# Patient Record
Sex: Male | Born: 2000
Health system: Southern US, Community
[De-identification: ages and names within clinical notes are randomized; demographics above are authoritative.]

## PROBLEM LIST (undated history)

## (undated) DIAGNOSIS — J45909 Unspecified asthma, uncomplicated: Secondary | ICD-10-CM

## (undated) DIAGNOSIS — T7840XA Allergy, unspecified, initial encounter: Secondary | ICD-10-CM

## (undated) HISTORY — DX: Allergy, unspecified, initial encounter: T78.40XA

---
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
## 2018-03-17 ENCOUNTER — Other Ambulatory Visit: Payer: Self-pay

## 2018-03-17 ENCOUNTER — Ambulatory Visit: Payer: 59 | Admitting: Family Medicine

## 2018-03-17 ENCOUNTER — Ambulatory Visit (HOSPITAL_BASED_OUTPATIENT_CLINIC_OR_DEPARTMENT_OTHER)
Admission: RE | Admit: 2018-03-17 | Discharge: 2018-03-17 | Disposition: A | Discharge status: Home / Self Care | Payer: 59 | Source: Ambulatory Visit | Attending: Family Medicine | Admitting: Family Medicine

## 2018-03-17 ENCOUNTER — Encounter: Payer: Self-pay | Admitting: Family Medicine

## 2018-03-17 VITALS — BP 123/73 | HR 75 | Ht 67.0 in | Wt 185.0 lb

## 2018-03-17 DIAGNOSIS — S6991XA Unspecified injury of right wrist, hand and finger(s), initial encounter: Secondary | ICD-10-CM | POA: Diagnosis not present

## 2018-03-17 NOTE — Patient Instructions (Signed)
Your x-rays and ultrasound look great. You have a finger sprain. Buddy taping as needed especially with sports the next 2 weeks. Icing, tylenol, ibuprofen if needed. Follow up with me as needed.

## 2018-03-19 ENCOUNTER — Encounter: Payer: Self-pay | Admitting: Family Medicine

## 2018-03-19 NOTE — Progress Notes (Signed)
PCP: Donato Schultz, DO  Subjective:   HPI: Patient is a 18 y.o. male here for right hand injury.  Patient reports he was playing basketball on 3/10 when the basketball hit his hand specifically over 3rd digit causing pain, throbbing, difficulty flexing this finger. Pain was severe but has improved some since then and is down to 5/10 level. Not taking any medicines for this but icing which has helped. No skin changes, numbness. No swelling.  Past Medical History:  Diagnosis Date  . Allergy   . Asthma     Current Outpatient Medications on File Prior to Visit  Medication Sig Dispense Refill  . ADVAIR HFA 115-21 MCG/ACT inhaler   4  . albuterol (PROVENTIL HFA;VENTOLIN HFA) 108 (90 BASE) MCG/ACT inhaler Inhale into the lungs every 6 (six) hours as needed for wheezing or shortness of breath.    Marland Kitchen albuterol (PROVENTIL) (2.5 MG/3ML) 0.083% nebulizer solution Take 2.5 mg by nebulization every 6 (six) hours as needed for wheezing or shortness of breath.    . EPINEPHrine 0.3 mg/0.3 mL IJ SOAJ injection   1  . fluticasone (FLONASE) 50 MCG/ACT nasal spray   6  . levocetirizine (XYZAL) 5 MG tablet Take 5 mg by mouth every evening.    . loratadine (CLARITIN) 10 MG tablet Take 10 mg by mouth daily.    . montelukast (SINGULAIR) 10 MG tablet Take 10 mg by mouth at bedtime.     No current facility-administered medications on file prior to visit.     History reviewed. No pertinent surgical history.  Allergies  Allergen Reactions  . Iodine Anaphylaxis  . Peanuts [Peanut Oil] Anaphylaxis  . Shellfish Allergy Anaphylaxis  . Soybean-Containing Drug Products Anaphylaxis    Social History   Socioeconomic History  . Marital status: Single    Spouse name: Not on file  . Number of children: Not on file  . Years of education: Not on file  . Highest education level: Not on file  Occupational History  . Not on file  Social Needs  . Financial resource strain: Not on file  . Food  insecurity:    Worry: Not on file    Inability: Not on file  . Transportation needs:    Medical: Not on file    Non-medical: Not on file  Tobacco Use  . Smoking status: Never Smoker  . Smokeless tobacco: Never Used  Substance and Sexual Activity  . Alcohol use: No    Alcohol/week: 0.0 standard drinks  . Drug use: No  . Sexual activity: Not on file  Lifestyle  . Physical activity:    Days per week: Not on file    Minutes per session: Not on file  . Stress: Not on file  Relationships  . Social connections:    Talks on phone: Not on file    Gets together: Not on file    Attends religious service: Not on file    Active member of club or organization: Not on file    Attends meetings of clubs or organizations: Not on file    Relationship status: Not on file  . Intimate partner violence:    Fear of current or ex partner: Not on file    Emotionally abused: Not on file    Physically abused: Not on file    Forced sexual activity: Not on file  Other Topics Concern  . Not on file  Social History Narrative  . Not on file    Family History  Problem Relation Age of Onset  . Diabetes Maternal Grandmother   . Diabetes Paternal Grandmother   . Kidney disease Paternal Grandmother        Drug Induced  . Hypertension Paternal Grandmother   . Hypertension Maternal Grandmother     BP 123/73   Pulse 75   Ht 5\' 7"  (1.702 m)   Wt 185 lb (83.9 kg)   BMI 28.98 kg/m   Review of Systems: See HPI above.     Objective:  Physical Exam:  Gen: NAD, comfortable in exam room  Right hand/digits: No deformity, malrotation, angulation, swelling. FROM with 5/5 strength flexion and extension digits. Mild TTP proximal phalanx 3rd digit.  No other tenderness. NVI distally.  Left hand/digits: No deformity. FROM with 5/5 strength. No tenderness to palpation. NVI distally.   Assessment & Plan:  1. Right 3rd digit injury - independently reviewed radiographs, performed and reviewed brief  MSK u/s which were negative for fracture, tendon injury.  2/2 mild sprain.  Reassured.  Buddy taping, icing, tylenol, ibuprofen if needed.  F/u prn.

## 2018-08-22 ENCOUNTER — Encounter (HOSPITAL_COMMUNITY): Payer: Self-pay | Admitting: Emergency Medicine

## 2018-08-22 ENCOUNTER — Other Ambulatory Visit: Payer: Self-pay

## 2018-08-22 ENCOUNTER — Emergency Department (HOSPITAL_COMMUNITY)
Admission: EM | Admit: 2018-08-22 | Discharge: 2018-08-23 | Disposition: A | Discharge status: Home / Self Care | Payer: 59 | Attending: Emergency Medicine | Admitting: Emergency Medicine

## 2018-08-22 ENCOUNTER — Emergency Department (HOSPITAL_COMMUNITY): Payer: 59

## 2018-08-22 DIAGNOSIS — J45909 Unspecified asthma, uncomplicated: Secondary | ICD-10-CM | POA: Diagnosis not present

## 2018-08-22 DIAGNOSIS — Y92410 Unspecified street and highway as the place of occurrence of the external cause: Secondary | ICD-10-CM | POA: Insufficient documentation

## 2018-08-22 DIAGNOSIS — Y999 Unspecified external cause status: Secondary | ICD-10-CM | POA: Insufficient documentation

## 2018-08-22 DIAGNOSIS — M7918 Myalgia, other site: Secondary | ICD-10-CM

## 2018-08-22 DIAGNOSIS — Y93I9 Activity, other involving external motion: Secondary | ICD-10-CM | POA: Diagnosis not present

## 2018-08-22 DIAGNOSIS — M791 Myalgia, unspecified site: Secondary | ICD-10-CM | POA: Diagnosis not present

## 2018-08-22 DIAGNOSIS — M79661 Pain in right lower leg: Secondary | ICD-10-CM | POA: Diagnosis present
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
## 2018-08-23 MED ORDER — IBUPROFEN 400 MG PO TABS
400.0000 mg | ORAL_TABLET | Freq: Once | ORAL | Status: AC
  Administered 2018-08-23: 400 mg via ORAL
  Filled 2018-08-23: qty 1
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
## 2020-03-01 ENCOUNTER — Other Ambulatory Visit (HOSPITAL_COMMUNITY): Payer: Self-pay | Admitting: Family Medicine

## 2020-03-01 MED FILL — AZITHROMYCIN 500 MG TABLET: 500 | 1 days supply | Qty: 2 | Fill #0

## 2020-04-16 ENCOUNTER — Other Ambulatory Visit: Payer: Self-pay

## 2020-04-17 ENCOUNTER — Ambulatory Visit (INDEPENDENT_AMBULATORY_CARE_PROVIDER_SITE_OTHER): Payer: 59 | Admitting: Family Medicine

## 2020-04-17 ENCOUNTER — Other Ambulatory Visit: Payer: Self-pay

## 2020-04-17 ENCOUNTER — Encounter: Payer: Self-pay | Admitting: Family Medicine

## 2020-04-17 VITALS — BP 102/68 | HR 78 | Temp 98.1°F | Resp 18 | Ht 67.0 in | Wt 123.0 lb

## 2020-04-17 DIAGNOSIS — Z1159 Encounter for screening for other viral diseases: Secondary | ICD-10-CM | POA: Diagnosis not present

## 2020-04-17 DIAGNOSIS — J302 Other seasonal allergic rhinitis: Secondary | ICD-10-CM

## 2020-04-17 DIAGNOSIS — Z23 Encounter for immunization: Secondary | ICD-10-CM

## 2020-04-17 DIAGNOSIS — Z0000 Encounter for general adult medical examination without abnormal findings: Secondary | ICD-10-CM | POA: Diagnosis not present

## 2020-04-17 DIAGNOSIS — J4520 Mild intermittent asthma, uncomplicated: Secondary | ICD-10-CM | POA: Diagnosis not present

## 2020-04-17 MED ORDER — MONTELUKAST SODIUM 10 MG PO TABS: 10.0000 mg | ORAL_TABLET | Freq: Every day | ORAL | 3 refills | Status: AC

## 2020-04-17 MED ORDER — ALBUTEROL SULFATE HFA 108 (90 BASE) MCG/ACT IN AERS
2.0000 | INHALATION_SPRAY | Freq: Four times a day (QID) | RESPIRATORY_TRACT | 3 refills | Status: AC | PRN
Start: 2020-04-17 — End: ?

## 2020-04-17 MED ORDER — ADVAIR HFA 115-21 MCG/ACT IN AERO: 2.0000 | INHALATION_SPRAY | Freq: Two times a day (BID) | RESPIRATORY_TRACT | 4 refills | Status: AC

## 2020-04-17 MED ORDER — LEVOCETIRIZINE DIHYDROCHLORIDE 5 MG PO TABS: 5.0000 mg | ORAL_TABLET | Freq: Every evening | ORAL | 3 refills | Status: AC

## 2020-04-17 NOTE — Patient Instructions (Signed)
Preventive Care 18-21 Years Old, Male Preventive care refers to lifestyle choices and visits with your health care provider that can promote health and wellness. At this stage in your life, you may start seeing a primary care physician instead of a pediatrician. It is important to take responsibility for your health and well-being. Preventive care for young adults includes:  A yearly physical exam. This is also called an annual wellness visit.  Regular dental and eye exams.  Immunizations.  Screening for certain conditions.  Healthy lifestyle choices, such as: ? Eating a healthy diet. ? Getting regular exercise. ? Not using drugs or products that contain nicotine and tobacco. ? Limiting alcohol use. What can I expect for my preventive care visit? Physical exam Your health care provider may check your:  Height and weight. These may be used to calculate your BMI (body mass index). BMI is a measurement that tells if you are at a healthy weight.  Heart rate and blood pressure.  Body temperature.  Skin for abnormal spots. Counseling Your health care provider may ask you questions about your:  Past medical problems.  Family's medical history.  Alcohol, tobacco, and drug use.  Home life and relationship well-being.  Access to firearms.  Emotional well-being.  Diet, exercise, and sleep habits.  Sexual activity and sexual health. What immunizations do I need? Vaccines are usually given at various ages, according to a schedule. Your health care provider will recommend vaccines for you based on your age, medical history, and lifestyle or other factors, such as travel or where you work.   What tests do I need? Blood tests  Lipid and cholesterol levels. These may be checked every 5 years starting at age 20.  Hepatitis C test.  Hepatitis B test. Screening  Genital exam to check for testicular cancer or hernias.  STD (sexually transmitted disease) testing, if you are at  risk. Other tests  Tuberculosis skin test.  Vision and hearing tests.  Skin exam. Talk with your health care provider about your test results, treatment options, and if necessary, the need for more tests. Follow these instructions at home: Eating and drinking  Eat a healthy diet that includes fresh fruits and vegetables, whole grains, lean protein, and low-fat dairy products.  Drink enough fluid to keep your urine pale yellow.  Do not drink alcohol if: ? Your health care provider tells you not to drink. ? You are under the legal drinking age. In the [place] the legal drinking age is 21.  If you drink alcohol: ? Limit how much you use to 0-2 drinks a day. ? Be aware of how much alcohol is in your drink. In the [place] one drink equals one 12 oz bottle of beer (355 mL), one 5 oz glass of wine (148 mL), or one 1 oz glass of hard liquor (44 mL).   Lifestyle  Take daily care of your teeth and gums. Brush your teeth every morning and night with fluoride toothpaste. Floss one time each day.  Stay active. Exercise for at least 30 minutes 5 or more days of the week.  Do not use any products that contain nicotine or tobacco, such as cigarettes, e-cigarettes, and chewing tobacco. If you need help quitting, ask your health care provider.  Do not use drugs.  If you are sexually active, practice safe sex. Use a condom or other form of protection to prevent STIs (sexually transmitted infections).  Find healthy ways to cope with stress, such as: ? Meditation, yoga,   or listening to music. ? Journaling. ? Talking to a trusted person. ? Spending time with friends and family. Safety  Always wear your seat belt while driving or riding in a vehicle.  Do not drive: ? If you have been drinking alcohol. Do not ride with someone who has been drinking. ? When you are tired or distracted. ? While texting.  Wear a helmet and other protective equipment during sports activities.  If you have  firearms in your house, make sure you follow all gun safety procedures.  Seek help if you have been bullied, physically abused, or sexually abused.  Use the Internet responsibly to avoid dangers, such as online bullying and online sex predators. What's next?  Go to your health care provider once a year for an annual wellness visit.  Ask your health care provider how often you should have your eyes and teeth checked.  Stay up to date on all vaccines. This information is not intended to replace advice given to you by your health care provider. Make sure you discuss any questions you have with your health care provider. Document Revised: 09/08/2018 Document Reviewed: 12/17/2017 Elsevier Patient Education  2021 Elsevier Inc.  

## 2020-04-17 NOTE — Progress Notes (Signed)
Subjective:   By signing my name below, I, Andrew Golden, attest that this documentation has been prepared under the direction and in the presence of Dr. Roma Schanz, DO. 04/17/2020   Patient ID: Andrew Golden, male    DOB: 02-28-2000, 20 y.o.   MRN: [id number]  Chief Complaint  Patient presents with  . Annual Exam    Concerns/questions: Pt says he needs Xzyxal and inhaler. Hep C screen due Covid shots: did not get -tlc,rma    HPI Patient is in today for comprehensive physical exam. He is accompanied by his girlfriend. He is still playing basketball occasionally. He request for his allergy inhaler to manage his asthma. He had to use his inhaler once recently due to "pollen season". He is now able to be near dogs and around shrimp without severe reactions. He would like to request a form is filled out in order for him to buy a dog for emotional support. He denies having shortness of breath, chest pain, tightness, or pressure. He has no nausea, vomiting, or diarrhea. He has no congestion, cough, fever, headache at this time. He has no testicular or scrotal pain.   Past Medical History:  Diagnosis Date  . Allergy   . Asthma     History reviewed. No pertinent surgical history.  Family History  Problem Relation Age of Onset  . Diabetes Maternal Grandmother   . Hypertension Maternal Grandmother   . Diabetes Paternal Grandmother   . Kidney disease Paternal Grandmother        Drug Induced  . Hypertension Paternal Grandmother     Social History   Socioeconomic History  . Marital status: Single    Spouse name: Not on file  . Number of children: Not on file  . Years of education: Not on file  . Highest education level: Not on file  Occupational History  . 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 Smoking status: Never Smoker  . Smokeless tobacco: Never Used  . Tobacco comment: partner on bcp  Vaping Use  . Vaping  Use: Never used  Substance and Sexual Activity  . Alcohol use: No    Alcohol/week: 0.0 standard drinks  . Drug use: Yes    Frequency: 4.0 times per week    Types: Marijuana  . Sexual activity: Yes    Partners: Female  Other Topics Concern  . Not on file  Social History Narrative  . Not on file   Social Determinants of Health   Financial Resource Strain: Not on file  Food Insecurity: Not on file  Transportation Needs: Not on file  Physical Activity: Not on file  Stress: Not on file  Social Connections: Not on file  Intimate Partner Violence: Not on file    Outpatient Medications Prior to Visit  Medication Sig Dispense Refill  . EPINEPHrine 0.3 mg/0.3 mL IJ SOAJ injection   1  . levocetirizine (XYZAL) 5 MG tablet Take 5 mg by mouth every evening.    . montelukast (SINGULAIR) 10 MG tablet Take 10 mg by mouth at bedtime.    Marland Kitchen ADVAIR HFA 115-21 MCG/ACT inhaler  (Patient not taking: Reported on 04/17/2020)  4  . albuterol (PROVENTIL HFA;VENTOLIN HFA) 108 (90 BASE) MCG/ACT inhaler Inhale into the lungs every 6 (six) hours as needed for wheezing or shortness of breath.    Marland Kitchen albuterol (PROVENTIL) (2.5 MG/3ML) 0.083% nebulizer solution Take 2.5 mg by nebulization every  6 (six) hours as needed for wheezing or shortness of breath. (Patient not taking: Reported on 04/17/2020)    . azithromycin (ZITHROMAX) 500 MG tablet TAKE 2 TABLETS BY MOUTH FOR 1 DOSE (Patient not taking: Reported on 04/17/2020) 2 tablet 0  . fluticasone (FLONASE) 50 MCG/ACT nasal spray  (Patient not taking: Reported on 04/17/2020)  6  . loratadine (CLARITIN) 10 MG tablet Take 10 mg by mouth daily. (Patient not taking: Reported on 04/17/2020)     No facility-administered medications prior to visit.    Allergies  Allergen Reactions  . Iodine Anaphylaxis  . Peanuts [Peanut Oil] Anaphylaxis  . Shellfish Allergy Anaphylaxis  . Soybean-Containing Drug Products Anaphylaxis    Review of Systems  Constitutional: Negative for  chills and fever.  HENT: Negative for ear pain, sinus pain and sore throat.   Eyes: Negative for pain.  Respiratory: Negative for cough and shortness of breath.   Cardiovascular: Negative for chest pain and leg swelling.  Genitourinary: Negative for flank pain, frequency and urgency.  Musculoskeletal: Negative for back pain.  Skin: Negative for rash.  Neurological: Negative for dizziness and headaches.       Objective:    Physical Exam Vitals and nursing note reviewed.  Constitutional:      General: He is not in acute distress.    Appearance: Normal appearance. He is not ill-appearing.  HENT:     Head: Normocephalic and atraumatic.     Right Ear: External ear normal.     Left Ear: External ear normal.  Eyes:     Extraocular Movements: Extraocular movements intact.     Pupils: Pupils are equal, round, and reactive to light.  Cardiovascular:     Rate and Rhythm: Normal rate and regular rhythm.     Pulses: Normal pulses.     Heart sounds: No murmur heard.   Pulmonary:     Effort: Pulmonary effort is normal. No respiratory distress.     Breath sounds: Normal breath sounds. No wheezing, rhonchi or rales.  Abdominal:     General: Bowel sounds are normal.     Palpations: Abdomen is soft. There is no mass.     Tenderness: There is no abdominal tenderness.  Musculoskeletal:        General: Normal range of motion.     Cervical back: Normal range of motion.  Skin:    General: Skin is warm and dry.  Neurological:     Mental Status: He is alert and oriented to person, place, and time.  Psychiatric:        Behavior: Behavior normal.     BP 102/68 (BP Location: Left Arm, Patient Position: Sitting, Cuff Size: Normal)   Pulse 78   Temp 98.1 F (36.7 C) (Oral)   Resp 18   Ht _0  (1.702 m)   Wt 123 lb (55.8 kg)   SpO2 96%   BMI 19.26 kg/m  Wt Readings from Last 3 Encounters:  04/17/20 123 lb (55.8 kg)  03/17/18 185 lb (83.9 kg) (85 %, Z= 1.05)*  06/05/15 132 lb (59.9  kg) (36 %, Z= -0.35)*   * Growth percentiles are based on CDC (Boys, 2-20 Years) data.    Diabetic Foot Exam - Simple   No data filed    No results found for: WBC, HGB, HCT, PLT, GLUCOSE, CHOL, TRIG, HDL, LDLDIRECT, LDLCALC, ALT, AST, NA, K, CL, CREATININE, BUN, CO2, TSH, PSA, INR, GLUF, HGBA1C, MICROALBUR  No results found for: TSH No results found for:  WBC, HGB, HCT, MCV, PLT No results found for: NA, K, CHLORIDE, CO2, GLUCOSE, BUN, CREATININE, BILITOT, ALKPHOS, AST, ALT, PROT, ALBUMIN, CALCIUM, ANIONGAP, EGFR, GFR No results found for: CHOL No results found for: HDL No results found for: LDLCALC No results found for: TRIG No results found for: CHOLHDL No results found for: HGBA1C     Assessment & Plan:   Problem List Items Addressed This Visit   None   Visit Diagnoses    Preventative health care    -  Primary   Relevant Orders   CBC with Differential/Platelet   Lipid panel   Comprehensive metabolic panel   TSH   Hepatitis C antibody   HIV Antibody (routine testing w rflx)   RPR   Hepatitis, Acute   Mild intermittent asthma without complication       Relevant Medications   montelukast (SINGULAIR) 10 MG tablet   levocetirizine (XYZAL) 5 MG tablet   albuterol (VENTOLIN HFA) 108 (90 Base) MCG/ACT inhaler   ADVAIR HFA 115-21 MCG/ACT inhaler   Seasonal allergies       Relevant Medications   montelukast (SINGULAIR) 10 MG tablet   levocetirizine (XYZAL) 5 MG tablet   albuterol (VENTOLIN HFA) 108 (90 Base) MCG/ACT inhaler   Need for meningitis vaccination       Relevant Orders   Meningococcal B, OMV (Bexsero) (Completed)   Need for hepatitis C screening test       Relevant Orders   Hepatitis C antibody      Meds ordered this encounter  Medications  . montelukast (SINGULAIR) 10 MG tablet    Sig: Take 1 tablet (10 mg total) by mouth at bedtime.    Dispense:  90 tablet    Refill:  3  . levocetirizine (XYZAL) 5 MG tablet    Sig: Take 1 tablet (5 mg total) by  mouth every evening.    Dispense:  90 tablet    Refill:  3  . albuterol (VENTOLIN HFA) 108 (90 Base) MCG/ACT inhaler    Sig: Inhale 2 puffs into the lungs every 6 (six) hours as needed for wheezing or shortness of breath.    Dispense:  3 each    Refill:  3  . ADVAIR HFA 115-21 MCG/ACT inhaler    Sig: Inhale 2 puffs into the lungs 2 (two) times daily.    Dispense:  1 each    Refill:  4    I, Ann Held, DO, personally preformed the services described in this documentation.  All medical record entries made by the scribe were at my direction and in my presence.  I have reviewed the chart and discharge instructions (if applicable) and agree that the record reflects my personal performance and is accurate and complete.  04/17/2020  I,Alexis Bryant,acting as a scribe for Ann Held, DO.,have documented all relevant documentation on the behalf of Ann Held, DO,as directed by  Ann Held, DO while in the presence of Ann Held, DO.  Ann Held, DO

## 2020-04-19 DIAGNOSIS — J4520 Mild intermittent asthma, uncomplicated: Secondary | ICD-10-CM | POA: Insufficient documentation

## 2020-04-19 DIAGNOSIS — Z0000 Encounter for general adult medical examination without abnormal findings: Secondary | ICD-10-CM | POA: Insufficient documentation

## 2020-04-19 DIAGNOSIS — J302 Other seasonal allergic rhinitis: Secondary | ICD-10-CM | POA: Insufficient documentation

## 2020-04-19 NOTE — Assessment & Plan Note (Signed)
Stable Refill inhalers  

## 2020-04-19 NOTE — Assessment & Plan Note (Signed)
Stable

## 2020-04-19 NOTE — Assessment & Plan Note (Signed)
ghm utd check labs See avs Men b given today

## 2020-04-23 ENCOUNTER — Telehealth: Payer: Self-pay

## 2020-04-23 ENCOUNTER — Other Ambulatory Visit: Payer: Self-pay

## 2020-04-23 ENCOUNTER — Telehealth (INDEPENDENT_AMBULATORY_CARE_PROVIDER_SITE_OTHER): Payer: 59 | Admitting: Family Medicine

## 2020-04-23 ENCOUNTER — Encounter: Payer: Self-pay | Admitting: Family Medicine

## 2020-04-23 VITALS — HR 82 | Temp 97.6°F

## 2020-04-23 DIAGNOSIS — J4520 Mild intermittent asthma, uncomplicated: Secondary | ICD-10-CM | POA: Diagnosis not present

## 2020-04-23 DIAGNOSIS — J45901 Unspecified asthma with (acute) exacerbation: Secondary | ICD-10-CM

## 2020-04-23 DIAGNOSIS — B999 Unspecified infectious disease: Secondary | ICD-10-CM | POA: Diagnosis not present

## 2020-04-23 MED ORDER — PREDNISONE 10 MG PO TABS: ORAL_TABLET | ORAL | 0 refills | Status: DC

## 2020-04-23 MED ORDER — AMOXICILLIN-POT CLAVULANATE 875-125 MG PO TABS: 1.0000 | ORAL_TABLET | Freq: Two times a day (BID) | ORAL | 0 refills | Status: DC

## 2020-04-23 NOTE — Telephone Encounter (Signed)
Pt seen already

## 2020-04-23 NOTE — Progress Notes (Signed)
MyChart Video Visit    Virtual Visit via Video Note   This visit type was conducted due to national recommendations for restrictions regarding the COVID-19 Pandemic (e.g. social distancing) in an effort to limit this patient's exposure and mitigate transmission in our community. This patient is at least at moderate risk for complications without adequate follow up. This format is felt to be most appropriate for this patient at this time. Physical exam was limited by quality of the video and audio technology used for the visit. Kristine Garbe Creft was able to get the patient set up on a video visit.  Patient location: Home Patient and provider in visit Provider location: Office  I discussed the limitations of evaluation and management by telemedicine and the availability of in person appointments. The patient expressed understanding and agreed to proceed.  Visit Date: 04/23/2020  Today's healthcare provider: Ann Held, DO     Subjective:    Patient ID: Andrew Golden, male    DOB: Sep 07, 2000, 20 y.o.   MRN: [id number]  Chief Complaint  Patient presents with  . Asthma    Wheezing started Friday night. Using albuterol inhaler and neb treatment. Tried Mucinex D. Needs note for Sat and Sunday.     HPI Patient is in today for exacerbation of asthma that started last Friday night.  He has been using his albuterol and advair and his nebulizer.  He is taking his antihistamine and singulair as well.  He also needs a note for work He is mom is a cma and listened to his lungs--- + wheezing with expiration   Past Medical History:  Diagnosis Date  . Allergy   . Asthma     No past surgical history on file.  Family History  Problem Relation Age of Onset  . Diabetes Maternal Grandmother   . Hypertension Maternal Grandmother   . Diabetes Paternal Grandmother   . Kidney disease Paternal Grandmother        Drug Induced  . Hypertension Paternal Grandmother     Social  History   Socioeconomic History  . Marital status: Single    Spouse name: Not on file  . Number of children: Not on file  . Years of education: Not on file  . Highest education level: Not on file  Occupational History  . 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 Smoking status: Never Smoker  . Smokeless tobacco: Never Used  . Tobacco comment: partner on bcp  Vaping Use  . Vaping Use: Never used  Substance and Sexual Activity  . Alcohol use: No    Alcohol/week: 0.0 standard drinks  . Drug use: Yes    Frequency: 4.0 times per week    Types: Marijuana  . Sexual activity: Yes    Partners: Female  Other Topics Concern  . Not on file  Social History Narrative  . Not on file   Social Determinants of Health   Financial Resource Strain: Not on file  Food Insecurity: Not on file  Transportation Needs: Not on file  Physical Activity: Not on file  Stress: Not on file  Social Connections: Not on file  Intimate Partner Violence: Not on file    Outpatient Medications Prior to Visit  Medication Sig Dispense Refill  . ADVAIR HFA 115-21 MCG/ACT inhaler Inhale 2 puffs into the lungs 2 (two) times daily. 1 each 4  . albuterol (VENTOLIN HFA) 108 (90 Base) MCG/ACT  inhaler Inhale 2 puffs into the lungs every 6 (six) hours as needed for wheezing or shortness of breath. 3 each 3  . EPINEPHrine 0.3 mg/0.3 mL IJ SOAJ injection   1  . levocetirizine (XYZAL) 5 MG tablet Take 1 tablet (5 mg total) by mouth every evening. 90 tablet 3  . montelukast (SINGULAIR) 10 MG tablet Take 1 tablet (10 mg total) by mouth at bedtime. 90 tablet 3   No facility-administered medications prior to visit.    Allergies  Allergen Reactions  . Iodine Anaphylaxis  . Peanuts [Peanut Oil] Anaphylaxis  . Shellfish Allergy Anaphylaxis  . Soybean-Containing Drug Products Anaphylaxis    Review of Systems  Constitutional: Positive for fever. Negative for chills.  HENT:  Positive for congestion. Negative for ear pain, sinus pain and sore throat.   Eyes: Negative for pain.  Respiratory: Positive for cough, shortness of breath and wheezing.   Gastrointestinal: Negative for abdominal pain, constipation, diarrhea, nausea and vomiting.  Genitourinary: Negative for dysuria, flank pain, frequency, hematuria and urgency.  Musculoskeletal: Negative for back pain and neck pain.       Objective:    Physical Exam Vitals and nursing note reviewed.  Constitutional:      General: He is not in acute distress.    Appearance: Normal appearance. He is not ill-appearing.  HENT:     Head: Normocephalic and atraumatic.  Pulmonary:     Breath sounds: Wheezing present.     Comments: Mom listened to his lungs this am-- + exp wheeze  Neurological:     Mental Status: He is alert.  Psychiatric:        Behavior: Behavior normal.        Thought Content: Thought content normal.     Pulse 82   Temp 97.6 F (36.4 C)   SpO2 95%  Wt Readings from Last 3 Encounters:  04/17/20 123 lb (55.8 kg)  03/17/18 185 lb (83.9 kg) (85 %, Z= 1.05)*  06/05/15 132 lb (59.9 kg) (36 %, Z= -0.35)*   * Growth percentiles are based on CDC (Boys, 2-20 Years) data.    Diabetic Foot Exam - Simple   No data filed    No results found for: WBC, HGB, HCT, PLT, GLUCOSE, CHOL, TRIG, HDL, LDLDIRECT, LDLCALC, ALT, AST, NA, K, CL, CREATININE, BUN, CO2, TSH, PSA, INR, GLUF, HGBA1C, MICROALBUR  No results found for: TSH No results found for: WBC, HGB, HCT, MCV, PLT No results found for: NA, K, CHLORIDE, CO2, GLUCOSE, BUN, CREATININE, BILITOT, ALKPHOS, AST, ALT, PROT, ALBUMIN, CALCIUM, ANIONGAP, EGFR, GFR No results found for: CHOL No results found for: HDL No results found for: LDLCALC No results found for: TRIG No results found for: CHOLHDL No results found for: HGBA1C     Assessment & Plan:   Problem List Items Addressed This Visit      Unprioritized   Mild intermittent asthma without  complication    pred taper abx per orders con't inhalers and singulair with antihistamine  F/u if no improvement       Relevant Medications   predniSONE (DELTASONE) 10 MG tablet    Other Visit Diagnoses    Exacerbation of allergic asthma due to infection    -  Primary   Relevant Medications   amoxicillin-clavulanate (AUGMENTIN) 875-125 MG tablet   predniSONE (DELTASONE) 10 MG tablet       Meds ordered this encounter  Medications  . amoxicillin-clavulanate (AUGMENTIN) 875-125 MG tablet    Sig: Take 1 tablet  by mouth 2 (two) times daily.    Dispense:  20 tablet    Refill:  0  . predniSONE (DELTASONE) 10 MG tablet    Sig: TAKE 3 TABLETS PO QD FOR 3 DAYS THEN TAKE 2 TABLETS PO QD FOR 3 DAYS THEN TAKE 1 TABLET PO QD FOR 3 DAYS THEN TAKE 1/2 TAB PO QD FOR 3 DAYS    Dispense:  20 tablet    Refill:  0    I discussed the assessment and treatment plan with the patient. The patient was provided an opportunity to ask questions and all were answered. The patient agreed with the plan and demonstrated an understanding of the instructions.   The patient was advised to call back or seek an in-person evaluation if the symptoms worsen or if the condition fails to improve as anticipated.     Ann Held, DO [place] [facility] [phone] (phone) [phone] (fax)  [place]

## 2020-04-23 NOTE — Assessment & Plan Note (Signed)
pred taper abx per orders con't inhalers and singulair with antihistamine  F/u if no improvement

## 2020-04-23 NOTE — Telephone Encounter (Signed)
Caller is needing to schedule an appointment. He would like to speak to a nurse about his cough, runny nose, and a headache. Caller disconnected before getting a phone number. Caller call backupdated information and sent to queue. Translation No Nurse Assessment Nurse: Christell Constant, RN, Adela Lank Date/Time (Eastern Time): 04/23/2020 6:17:58 AM Confirm and document reason for call. If symptomatic, describe symptoms. ---Caller stated he has a cough, runny nose and a headache. No fever. Does the patient have any new or worsening symptoms? ---Yes Will a triage be completed? ---Yes Related visit to physician within the last 2 weeks? ---No Does the PT have any chronic conditions? (i.e. diabetes, asthma, this includes High risk factors for pregnancy, etc.) ---Yes List chronic conditions. ---Asthma, Seasonal allergie

## 2020-05-01 ENCOUNTER — Ambulatory Visit: Payer: 59

## 2020-05-01 ENCOUNTER — Other Ambulatory Visit: Payer: 59

## 2020-05-18 NOTE — Addendum Note (Signed)
Addended by: Mervin Kung A on: 05/18/2020 03:47 PM   Modules accepted: Orders

## 2020-05-21 ENCOUNTER — Ambulatory Visit: Payer: 59

## 2020-05-21 ENCOUNTER — Other Ambulatory Visit: Payer: 59

## 2020-09-17 ENCOUNTER — Encounter: Payer: Self-pay | Admitting: Family Medicine

## 2020-09-17 ENCOUNTER — Telehealth (INDEPENDENT_AMBULATORY_CARE_PROVIDER_SITE_OTHER): Payer: 59 | Admitting: Family Medicine

## 2020-09-17 ENCOUNTER — Other Ambulatory Visit: Payer: Self-pay

## 2020-09-17 DIAGNOSIS — R059 Cough, unspecified: Secondary | ICD-10-CM | POA: Insufficient documentation

## 2020-09-17 DIAGNOSIS — J324 Chronic pansinusitis: Secondary | ICD-10-CM

## 2020-09-17 MED ORDER — AMOXICILLIN-POT CLAVULANATE 875-125 MG PO TABS: 1.0000 | ORAL_TABLET | Freq: Two times a day (BID) | ORAL | 0 refills | Status: DC

## 2020-09-17 MED ORDER — PROMETHAZINE-DM 6.25-15 MG/5ML PO SYRP: 5.0000 mL | ORAL_SOLUTION | Freq: Four times a day (QID) | ORAL | 0 refills | Status: AC | PRN

## 2020-09-17 NOTE — Assessment & Plan Note (Signed)
abx per orders  Cough meds covid test neg x2 Call or rto if symptoms persist

## 2020-09-17 NOTE — Progress Notes (Signed)
MyChart Video Visit    Virtual Visit via Video Note   This visit type was conducted due to national recommendations for restrictions regarding the COVID-19 Pandemic (e.g. social distancing) in an effort to limit this patient's exposure and mitigate transmission in our community. This patient is at least at moderate risk for complications without adequate follow up. This format is felt to be most appropriate for this patient at this time. Physical exam was limited by quality of the video and audio technology used for the visit. Alinda Dooms was able to get the patient set up on a video visit.  Patient location: home with girlfriend  Patient and provider in visit Provider location: Office  I discussed the limitations of evaluation and management by telemedicine and the availability of in person appointments. The patient expressed understanding and agreed to proceed.  Visit Date: 09/17/2020  Today's healthcare provider: Ann Held, DO     Subjective:    Patient ID: Andrew Golden, male    DOB: Jun 06, 2000, 20 y.o.   MRN: [id number]  Chief Complaint  Patient presents with   Fever    Pt states COVID test was negative on Saturday and today. and states fever went away. Pt states productive cough and nausea. Pt states having fatigue and headache.     HPI Patient is in today for runny nose, cough that is productive,   he is taking mucinex D and advil.   Pt had a fever 102.   + chills + nasal congestion  no body aches,   Past Medical History:  Diagnosis Date   Allergy    Asthma     No past surgical history on file.  Family History  Problem Relation Age of Onset   Diabetes Maternal Grandmother    Hypertension Maternal Grandmother    Diabetes Paternal Grandmother    Kidney disease Paternal Grandmother        Drug Induced   Hypertension Paternal Grandmother     Social History   Socioeconomic History   Marital status: Single    Spouse name: Not on  file   Number of children: Not on file   Years of education: Not on file   Highest education level: Not on file  Occupational History   Occupation: tires    Comment: Corporate treasurer: [employer]  Tobacco Use   Smoking status: Never   Smokeless tobacco: Never   Tobacco comments:    partner on bcp  Vaping Use   Vaping Use: Never used  Substance and Sexual Activity   Alcohol use: No    Alcohol/week: 0.0 standard drinks   Drug use: Yes    Frequency: 4.0 times per week    Types: Marijuana   Sexual activity: Yes    Partners: Female  Other Topics Concern   Not on file  Social History Narrative   Not on file   Social Determinants of Health   Financial Resource Strain: Not on file  Food Insecurity: Not on file  Transportation Needs: Not on file  Physical Activity: Not on file  Stress: Not on file  Social Connections: Not on file  Intimate Partner Violence: Not on file    Outpatient Medications Prior to Visit  Medication Sig Dispense Refill   ADVAIR [facility] 115-21 MCG/ACT inhaler Inhale 2 puffs into the lungs 2 (two) times daily. 1 each 4   albuterol (VENTOLIN HFA) 108 (90 Base) MCG/ACT inhaler Inhale 2 puffs into the lungs  every 6 (six) hours as needed for wheezing or shortness of breath. 3 each 3   amoxicillin-clavulanate (AUGMENTIN) 875-125 MG tablet Take 1 tablet by mouth 2 (two) times daily. 20 tablet 0   EPINEPHrine 0.3 mg/0.3 mL IJ SOAJ injection   1   levocetirizine (XYZAL) 5 MG tablet Take 1 tablet (5 mg total) by mouth every evening. 90 tablet 3   montelukast (SINGULAIR) 10 MG tablet Take 1 tablet (10 mg total) by mouth at bedtime. 90 tablet 3   predniSONE (DELTASONE) 10 MG tablet TAKE 3 TABLETS PO QD FOR 3 DAYS THEN TAKE 2 TABLETS PO QD FOR 3 DAYS THEN TAKE 1 TABLET PO QD FOR 3 DAYS THEN TAKE 1/2 TAB PO QD FOR 3 DAYS 20 tablet 0   No facility-administered medications prior to visit.    Allergies  Allergen Reactions   Iodine Anaphylaxis   Peanuts [Peanut Oil]  Anaphylaxis   Shellfish Allergy Anaphylaxis   Soybean-Containing Drug Products Anaphylaxis    Review of Systems  Constitutional:  Positive for chills and fever. Negative for malaise/fatigue.  HENT:  Positive for congestion and sinus pain. Negative for hearing loss.   Eyes:  Negative for discharge.  Respiratory:  Positive for cough and sputum production. Negative for shortness of breath.   Cardiovascular:  Negative for chest pain, palpitations and leg swelling.  Gastrointestinal:  Negative for abdominal pain, blood in stool, constipation, diarrhea, heartburn, nausea and vomiting.  Genitourinary:  Negative for dysuria, frequency, hematuria and urgency.  Musculoskeletal:  Negative for back pain, falls and myalgias.  Skin:  Negative for rash.  Neurological:  Negative for dizziness, sensory change, loss of consciousness, weakness and headaches.  Endo/Heme/Allergies:  Negative for environmental allergies. Does not bruise/bleed easily.  Psychiatric/Behavioral:  Negative for depression and suicidal ideas. The patient is not nervous/anxious and does not have insomnia.       Objective:    Physical Exam Vitals and nursing note reviewed.  Constitutional:      Appearance: Normal appearance. He is well-developed. He is not ill-appearing.  HENT:     Head: Normocephalic and atraumatic.  Neck:     Thyroid: No thyromegaly.  Pulmonary:     Effort: Pulmonary effort is normal.  Neurological:     Mental Status: He is alert and oriented to person, place, and time.  Psychiatric:        Behavior: Behavior normal.        Thought Content: Thought content normal.        Judgment: Judgment normal.    There were no vitals taken for this visit. Wt Readings from Last 3 Encounters:  04/17/20 123 lb (55.8 kg)  03/17/18 185 lb (83.9 kg) (85 %, Z= 1.05)*  06/05/15 132 lb (59.9 kg) (36 %, Z= -0.35)*   * Growth percentiles are based on CDC (Boys, 2-20 Years) data.    Diabetic Foot Exam - Simple   No data  filed    No results found for: WBC, HGB, HCT, PLT, GLUCOSE, CHOL, TRIG, HDL, LDLDIRECT, LDLCALC, ALT, AST, NA, K, CL, CREATININE, BUN, CO2, TSH, PSA, INR, GLUF, HGBA1C, MICROALBUR  No results found for: TSH No results found for: WBC, HGB, HCT, MCV, PLT No results found for: NA, K, CHLORIDE, CO2, GLUCOSE, BUN, CREATININE, BILITOT, ALKPHOS, AST, ALT, PROT, ALBUMIN, CALCIUM, ANIONGAP, EGFR, GFR No results found for: CHOL No results found for: HDL No results found for: LDLCALC No results found for: TRIG No results found for: CHOLHDL No results found for: HGBA1C  Assessment & Plan:   Problem List Items Addressed This Visit       Unprioritized   Cough - Primary   Relevant Medications   promethazine-dextromethorphan (PROMETHAZINE-DM) 6.25-15 MG/5ML syrup   Pansinusitis    abx per orders  Cough meds covid test neg x2 Call or rto if symptoms persist       Relevant Medications   amoxicillin-clavulanate (AUGMENTIN) 875-125 MG tablet   promethazine-dextromethorphan (PROMETHAZINE-DM) 6.25-15 MG/5ML syrup    I am having Andrew Barman "Ke" start on amoxicillin-clavulanate and promethazine-dextromethorphan. I am also having him maintain his EPINEPHrine, montelukast, levocetirizine, albuterol, Advair HFA, amoxicillin-clavulanate, and predniSONE.  Meds ordered this encounter  Medications   amoxicillin-clavulanate (AUGMENTIN) 875-125 MG tablet    Sig: Take 1 tablet by mouth 2 (two) times daily.    Dispense:  20 tablet    Refill:  0   promethazine-dextromethorphan (PROMETHAZINE-DM) 6.25-15 MG/5ML syrup    Sig: Take 5 mLs by mouth 4 (four) times daily as needed.    Dispense:  118 mL    Refill:  0    I discussed the assessment and treatment plan with the patient. The patient was provided an opportunity to ask questions and all were answered. The patient agreed with the plan and demonstrated an understanding of the instructions.   The patient was advised to call back or seek an  in-person evaluation if the symptoms worsen or if the condition fails to improve as anticipated.     Ann Held, DO [place] [facility] [phone] (phone) [phone] (fax)  [place]

## 2021-01-29 ENCOUNTER — Ambulatory Visit (INDEPENDENT_AMBULATORY_CARE_PROVIDER_SITE_OTHER): Payer: 59 | Admitting: Family Medicine

## 2021-01-29 ENCOUNTER — Encounter: Payer: Self-pay | Admitting: Family Medicine

## 2021-01-29 ENCOUNTER — Telehealth: Payer: Self-pay

## 2021-01-29 VITALS — BP 108/72 | HR 94 | Temp 98.2°F | Resp 12 | Ht 67.0 in | Wt 124.8 lb

## 2021-01-29 DIAGNOSIS — R062 Wheezing: Secondary | ICD-10-CM | POA: Diagnosis not present

## 2021-01-29 DIAGNOSIS — R059 Cough, unspecified: Secondary | ICD-10-CM | POA: Diagnosis not present

## 2021-01-29 DIAGNOSIS — J069 Acute upper respiratory infection, unspecified: Secondary | ICD-10-CM

## 2021-01-29 LAB — POCT INFLUENZA A/B
Influenza A, POC: NEGATIVE
Influenza B, POC: NEGATIVE

## 2021-01-29 LAB — POC COVID19 BINAXNOW: SARS Coronavirus 2 Ag: NEGATIVE

## 2021-01-29 MED ORDER — PREDNISONE 20 MG PO TABS: 40.0000 mg | ORAL_TABLET | Freq: Every day | ORAL | 0 refills | Status: AC

## 2021-01-29 NOTE — Progress Notes (Signed)
Acute Office Visit  Subjective:    Patient ID: Andrew Golden, male    DOB: 2000/01/24, 21 y.o.   MRN: [id number]  Chief Complaint  Patient presents with   Fever    100    Cough   congrestion    Runny nose      Patient is in today for URI symptoms.   Symptoms started Saturday (3 days ago) and include: feeling hot/flushed, fevers (up to 102F on Saturday, 100F today), headache, sinus pressure, sore throat, sneezing, productive cough with yellow sputum, rhinorrhea, nasal congestion. Additionally, he has had some occasional wheezing and shortness of breath - starting Saturday he has had to use his albuterol inhaler multiple times per day, so far has only used once today. He denies any chest pain, nausea, vomiting, diarrhea, body aches, loss of taste/smell. Recently around girlfriend's nephew who had viral symptoms.      Past Medical History:  Diagnosis Date   Allergy    Asthma     No past surgical history on file.  Family History  Problem Relation Age of Onset   Diabetes Maternal Grandmother    Hypertension Maternal Grandmother    Diabetes Paternal Grandmother    Kidney disease Paternal Grandmother        Drug Induced   Hypertension Paternal Grandmother     Social History   Socioeconomic History   Marital status: Single    Spouse name: Not on file   Number of children: Not on file   Years of education: Not on file   Highest education level: Not on file  Occupational History   Occupation: tires    Comment: Corporate treasurer: [employer]  Tobacco Use   Smoking status: Never   Smokeless tobacco: Never   Tobacco comments:    partner on bcp  Vaping Use   Vaping Use: Never used  Substance and Sexual Activity   Alcohol use: No    Alcohol/week: 0.0 standard drinks   Drug use: Yes    Frequency: 4.0 times per week    Types: Marijuana   Sexual activity: Yes    Partners: Female  Other Topics Concern   Not on file  Social History Narrative   Not on  file   Social Determinants of Health   Financial Resource Strain: Not on file  Food Insecurity: Not on file  Transportation Needs: Not on file  Physical Activity: Not on file  Stress: Not on file  Social Connections: Not on file  Intimate Partner Violence: Not on file    Outpatient Medications Prior to Visit  Medication Sig Dispense Refill   ADVAIR [facility] 115-21 MCG/ACT inhaler Inhale 2 puffs into the lungs 2 (two) times daily. 1 each 4   albuterol (VENTOLIN HFA) 108 (90 Base) MCG/ACT inhaler Inhale 2 puffs into the lungs every 6 (six) hours as needed for wheezing or shortness of breath. 3 each 3   EPINEPHrine 0.3 mg/0.3 mL IJ SOAJ injection   1   levocetirizine (XYZAL) 5 MG tablet Take 1 tablet (5 mg total) by mouth every evening. 90 tablet 3   montelukast (SINGULAIR) 10 MG tablet Take 1 tablet (10 mg total) by mouth at bedtime. 90 tablet 3   promethazine-dextromethorphan (PROMETHAZINE-DM) 6.25-15 MG/5ML syrup Take 5 mLs by mouth 4 (four) times daily as needed. 118 mL 0   amoxicillin-clavulanate (AUGMENTIN) 875-125 MG tablet Take 1 tablet by mouth 2 (two) times daily. 20 tablet 0   amoxicillin-clavulanate (AUGMENTIN) 875-125 MG  tablet Take 1 tablet by mouth 2 (two) times daily. 20 tablet 0   predniSONE (DELTASONE) 10 MG tablet TAKE 3 TABLETS PO QD FOR 3 DAYS THEN TAKE 2 TABLETS PO QD FOR 3 DAYS THEN TAKE 1 TABLET PO QD FOR 3 DAYS THEN TAKE 1/2 TAB PO QD FOR 3 DAYS 20 tablet 0   No facility-administered medications prior to visit.    Allergies  Allergen Reactions   Iodine Anaphylaxis   Peanuts [Peanut Oil] Anaphylaxis   Shellfish Allergy Anaphylaxis   Soybean-Containing Drug Products Anaphylaxis    Review of Systems All review of systems negative except what is listed in the HPI     Objective:    Physical Exam Vitals reviewed.  Constitutional:      Appearance: Normal appearance. He is normal weight.  HENT:     Head: Normocephalic and atraumatic.     Right Ear: Tympanic  membrane normal.     Left Ear: Tympanic membrane normal.     Nose: Congestion and rhinorrhea present.     Mouth/Throat:     Mouth: Mucous membranes are moist.     Pharynx: Oropharynx is clear. Posterior oropharyngeal erythema present. No oropharyngeal exudate.  Eyes:     Extraocular Movements: Extraocular movements intact.     Conjunctiva/sclera: Conjunctivae normal.  Cardiovascular:     Rate and Rhythm: Normal rate and regular rhythm.  Pulmonary:     Effort: Pulmonary effort is normal.     Breath sounds: Normal breath sounds.  Musculoskeletal:     Cervical back: Normal range of motion and neck supple. No tenderness.  Lymphadenopathy:     Cervical: No cervical adenopathy.  Skin:    General: Skin is warm and dry.  Neurological:     General: No focal deficit present.     Mental Status: He is alert and oriented to person, place, and time. Mental status is at baseline.  Psychiatric:        Mood and Affect: Mood normal.        Behavior: Behavior normal.        Thought Content: Thought content normal.        Judgment: Judgment normal.    BP 108/72 (BP Location: Left Arm, Cuff Size: Normal)    Pulse 94    Temp 98.2 F (36.8 C) (Oral)    Resp 12    Ht '5\' 7"'  (1.702 m)    Wt 124 lb 12.8 oz (56.6 kg)    SpO2 98%    BMI 19.55 kg/m  Wt Readings from Last 3 Encounters:  01/29/21 124 lb 12.8 oz (56.6 kg)  04/17/20 123 lb (55.8 kg)  03/17/18 185 lb (83.9 kg) (85 %, Z= 1.05)*   * Growth percentiles are based on CDC (Boys, 2-20 Years) data.    Health Maintenance Due  Topic Date Due   COVID-19 Vaccine (1) Never done   HIV Screening  Never done   Hepatitis C Screening  Never done   TETANUS/TDAP  05/28/2020   INFLUENZA VACCINE  08/06/2020    There are no preventive care reminders to display for this patient.   No results found for: TSH No results found for: WBC, HGB, HCT, MCV, PLT No results found for: NA, K, CHLORIDE, CO2, GLUCOSE, BUN, CREATININE, BILITOT, ALKPHOS, AST, ALT, PROT,  ALBUMIN, CALCIUM, ANIONGAP, EGFR, GFR No results found for: CHOL No results found for: HDL No results found for: LDLCALC No results found for: TRIG No results found for: CHOLHDL No results found  for: HGBA1C     Assessment & Plan:   1. Viral URI with cough 2. Wheezing 3. Cough, unspecified type Flu and COVID negative Likely another viral infection - Continue supportive measures including rest, hydration, humidifier use, steam showers, warm compresses to sinuses, warm liquids with lemon and honey, and over-the-counter cough, cold, and analgesics as needed.   Given your wheezing and asthma history, I will give you a few days of prednisone.  If no improvement by day 10 of symptoms, or if significantly worse before then, please schedule another appointment and we can consider antibiotics at that time  - predniSONE (DELTASONE) 20 MG tablet; Take 2 tablets (40 mg total) by mouth daily with breakfast for 5 days.  Dispense: 10 tablet; Refill: 0 - POCT Influenza A/B - POC COVID-19   Follow-up as needed. Patient aware of signs/symptoms requiring further/urgent evaluation.   Terrilyn Saver, NP

## 2021-01-29 NOTE — Telephone Encounter (Signed)
Noted  

## 2021-01-29 NOTE — Patient Instructions (Addendum)
Flu and COVID negative Likely another viral infection - Continue supportive measures including rest, hydration, humidifier use, steam showers, warm compresses to sinuses, warm liquids with lemon and honey, and over-the-counter cough, cold, and analgesics as needed.   Given your wheezing and asthma history, I will give you a few days of prednisone.  If no improvement by day 10 of symptoms, or if significantly worse before then, please schedule another appointment and we can consider antibiotics at that time

## 2021-01-29 NOTE — Telephone Encounter (Signed)
LM requesting call back to schedule patient with other provider today.   [facility] Night - ClientTELEPHONE ADVICE RECORDAccessNurse Patient Name:Viyaan Pokorski Chief Complaint Headache Reason for Call Symptomatic / Request for Health Information Initial Comment Caller states he has been sick since Saturday. His covid tests is negative. He would like to schedule an appointment. He has sneezing, coughing, headache, scratchy throat, and has been feeling hot. Additional Comment There is no blood in the cough. Translation No Nurse Assessment Nurse: Mariah Milling, RN, Diana Eves Date/Time Lamount Cohen Time): 01/28/2021 5:54:52 PM Confirm and document reason for call. If symptomatic, describe symptoms. ---Caller states he has been sick since Saturday. His covid tests is negative. He would like to schedule an appointment. He has sneezing, coughing, headache, scratchy throat, and has been feeling hot. no thermometer. sometimes has sob. no chest pain no dizziness. Does the patient have any new or worsening symptoms? ---Yes Will a triage be completed? ---Yes Related visit to physician within the last 2 weeks? ---No Does the PT have any chronic conditions? (i.e. diabetes, asthma, this includes High risk factors for pregnancy, etc.) ---Yes List chronic conditions. ---asthma Is this a behavioral health or substance abuse call? ---No Guidelines Guideline Title Affirmed Question Affirmed Notes Nurse Date/Time (Eastern Time) Common Cold [1] Using nasal washes and pain medicine > 24 hours AND [2] sinus pain Mariah Milling, RN, Diana Eves 01/28/2021 5:56:21 PM PLEASE NOTE: All timestamps contained within this report are represented as [place] Standard Time. CONFIDENTIALTY NOTICE: This fax transmission is intended only for the addressee. It contains information that is legally privileged, confidential or otherwise protected from use or disclosure. If you are not the intended recipient, you  are strictly prohibited from reviewing, disclosing, copying using or disseminating any of this information or taking any action in reliance on or regarding this information. If you have received this fax in error, please notify us immediately by telephone so that we can arrange for its return to [place]. Phone: [phone], Toll-Free: [phone], Fax: [phone] Page: 2 of 2 Call Id: [id number] Guidelines Guideline Title Affirmed Question Affirmed Notes Nurse Date/Time Lamount Cohen Time) (lower forehead, cheekbone, or eye) persists Disp. Time Lamount Cohen Time) Disposition Final User 01/28/2021 5:16:51 PM Attempt made - message left Lilian Kapur 01/28/2021 5:36:37 PM Attempt made - message left Mariah Milling, RN, Diana Eves 01/28/2021 6:00:22 PM SEE PCP WITHIN 3 DAYS Yes Mariah Milling, RN, Diana Eves Caller Disagree/Comply Comply Caller Understands Yes PreDisposition Did not know what to do Care Advice Given Per Guideline SEE PCP WITHIN 3 DAYS: * You need to be seen within 2 or 3 days. * PCP VISIT: Call your doctor (or NP/PA) during regula

## 2022-11-05 ENCOUNTER — Telehealth: Payer: Self-pay | Admitting: Family Medicine

## 2022-11-05 ENCOUNTER — Ambulatory Visit: Payer: 59 | Admitting: Family Medicine

## 2022-11-05 NOTE — Telephone Encounter (Signed)
10.30.24 same day no show

## 2022-11-06 ENCOUNTER — Ambulatory Visit: Payer: 59 | Admitting: Family Medicine

## 2022-11-06 NOTE — Telephone Encounter (Signed)
Pt has requested TOC to [facility] as he lives a few minutes from our office/Dr. Janee Morn preferred. Please advise.

## 2022-11-11 NOTE — Telephone Encounter (Signed)
Phone call not going through, sent mychart message to give [place] a cb to schedule a Toc.
